# Patient Record
Sex: Female | Born: 2005 | Race: White | Hispanic: No | Marital: Single | Smoking: Never smoker
Health system: Southern US, Community
[De-identification: ages and names within clinical notes are randomized; demographics above are authoritative.]

## PROBLEM LIST (undated history)

## (undated) DIAGNOSIS — J302 Other seasonal allergic rhinitis: Secondary | ICD-10-CM

## (undated) DIAGNOSIS — F419 Anxiety disorder, unspecified: Secondary | ICD-10-CM

## (undated) HISTORY — DX: Anxiety disorder, unspecified: F41.9

## (undated) HISTORY — PX: NO PAST SURGERIES: SHX2092

---
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
## 2024-01-26 ENCOUNTER — Telehealth: Admitting: Physician Assistant

## 2024-01-26 DIAGNOSIS — H6503 Acute serous otitis media, bilateral: Secondary | ICD-10-CM | POA: Diagnosis not present

## 2024-01-26 MED ORDER — AMOXICILLIN-POT CLAVULANATE 875-125 MG PO TABS
1.0000 | ORAL_TABLET | Freq: Two times a day (BID) | ORAL | 0 refills | Status: DC
Start: 2024-01-26 — End: 2024-04-04

## 2024-01-26 MED ORDER — CIPROFLOXACIN-DEXAMETHASONE 0.3-0.1 % OT SUSP
4.0000 [drp] | Freq: Two times a day (BID) | OTIC | 0 refills | Status: AC
Start: 2024-01-26 — End: 2024-02-02

## 2024-01-26 NOTE — Progress Notes (Signed)

## 2024-04-04 ENCOUNTER — Encounter (HOSPITAL_COMMUNITY): Payer: Self-pay

## 2024-04-04 ENCOUNTER — Encounter

## 2024-04-04 ENCOUNTER — Telehealth: Admitting: Physician Assistant

## 2024-04-04 ENCOUNTER — Ambulatory Visit (HOSPITAL_COMMUNITY): Admission: EM | Admit: 2024-04-04 | Discharge: 2024-04-04 | Disposition: A | Discharge status: Home / Self Care

## 2024-04-04 DIAGNOSIS — J020 Streptococcal pharyngitis: Secondary | ICD-10-CM | POA: Insufficient documentation

## 2024-04-04 DIAGNOSIS — R519 Headache, unspecified: Secondary | ICD-10-CM

## 2024-04-04 DIAGNOSIS — R112 Nausea with vomiting, unspecified: Secondary | ICD-10-CM

## 2024-04-04 LAB — POCT MONO SCREEN (KUC): Mono, POC: NEGATIVE

## 2024-04-04 LAB — POCT RAPID STREP A (OFFICE): Rapid Strep A Screen: NEGATIVE

## 2024-04-04 MED ORDER — ACETAMINOPHEN 325 MG PO TABS
650.0000 mg | ORAL_TABLET | Freq: Once | ORAL | Status: AC
  Administered 2024-04-04: 650 mg via ORAL

## 2024-04-04 MED ORDER — PREDNISONE 20 MG PO TABS
40.0000 mg | ORAL_TABLET | Freq: Every day | ORAL | 0 refills | Status: AC
Start: 2024-04-04 — End: 2024-04-09

## 2024-04-04 MED ORDER — ACETAMINOPHEN 325 MG PO TABS
ORAL_TABLET | ORAL | Status: AC
  Filled 2024-04-04: qty 2

## 2024-04-04 MED ORDER — AMOXICILLIN 500 MG PO CAPS
500.0000 mg | ORAL_CAPSULE | Freq: Two times a day (BID) | ORAL | 0 refills | Status: AC
Start: 2024-04-04 — End: 2024-04-14

## 2024-04-04 NOTE — Discharge Instructions (Addendum)
  1. Streptococcal sore throat (Primary) - POCT rapid strep A complete in UC is negative for strep and - acetaminophen  (TYLENOL ) tablet 650 mg - POC mono screen completed UC is negative for mononucleosis - Culture, group A strep (throat) collected in UC sent to lab for further testing results should be available in 2 to 3 days. - amoxicillin  (AMOXIL ) 500 MG capsule; Take 1 capsule (500 mg total) by mouth 2 (two) times daily for 10 days.  Dispense: 20 capsule; Refill: 0 - predniSONE  (DELTASONE ) 20 MG tablet; Take 2 tablets (40 mg total) by mouth daily for 5 days.  Dispense: 10 tablet; Refill: 0 -Continue to monitor symptoms for any change in severity if there is any escalation of current symptoms or development of new symptoms follow-up in ER for further evaluation and management.

## 2024-04-04 NOTE — Progress Notes (Signed)
  Because of intractable headache for this long with nausea/vomiting and inability to eat, I feel your condition warrants further evaluation and I recommend that you be seen in a face-to-face visit.   NOTE: There will be NO CHARGE for this E-Visit   If you are having a true medical emergency, please call 911.     For an urgent face to face visit, [facility] has multiple urgent care centers for your convenience.  Click the link below for the full list of locations and hours, walk-in wait times, appointment scheduling options and driving directions:  Urgent Care - [place], [place], [place], [place], [place], [place]  [place]     Your MyChart E-visit questionnaire answers were reviewed by a board certified advanced clinical practitioner to complete your personal care plan based on your specific symptoms.    Thank you for using e-Visits.

## 2024-04-04 NOTE — ED Provider Notes (Signed)
 [facility]  Note:  This document was prepared using Dragon voice recognition software and may include unintentional dictation errors.  MRN: [id number] DOB: 15-Apr-2006  Subjective:   Judy Barnes is a 18 y.o. female presenting for sore throat, body aches, chills, fever, sweats, fatigue, cough, headache x 1 week.  Patient denies any known sick contacts.  Patient has not taken anything over-the-counter to treat symptoms.  Patient concern for strep pharyngitis.  No current facility-administered medications for this encounter.  Current Outpatient Medications:    amoxicillin  (AMOXIL ) 500 MG capsule, Take 1 capsule (500 mg total) by mouth 2 (two) times daily for 10 days., Disp: 20 capsule, Rfl: 0   predniSONE  (DELTASONE ) 20 MG tablet, Take 2 tablets (40 mg total) by mouth daily for 5 days., Disp: 10 tablet, Rfl: 0   No Known Allergies  Past Medical History:  Diagnosis Date   Asthma    Seasonal allergies      History reviewed. No pertinent surgical history.  History reviewed. No pertinent family history.  Social History   Tobacco Use   Smoking status: Never  Vaping Use   Vaping status: Never Used  Substance Use Topics   Alcohol use: No   Drug use: No    ROS Refer to HPI for ROS details.  Objective:   Vitals: BP 96/67 (BP Location: Left Arm)   Pulse (!) 157   Temp (!) 102.4 F (39.1 C) (Oral)   Resp 16   LMP 03/27/2024 (Exact Date)   SpO2 94%   Physical Exam Vitals and nursing note reviewed.  Constitutional:      General: She is not in acute distress.    Appearance: Normal appearance. She is well-developed. She is not ill-appearing or toxic-appearing.  HENT:     Head: Normocephalic and atraumatic.     Nose: Congestion present. No rhinorrhea.     Mouth/Throat:     Mouth: Mucous membranes are moist.     Pharynx: Oropharyngeal exudate and posterior oropharyngeal erythema present.  Eyes:     General:        Right eye: No discharge.        Left  eye: No discharge.     Extraocular Movements: Extraocular movements intact.     Conjunctiva/sclera: Conjunctivae normal.  Cardiovascular:     Rate and Rhythm: Normal rate.  Pulmonary:     Effort: Pulmonary effort is normal. No respiratory distress.  Skin:    General: Skin is warm and dry.  Neurological:     General: No focal deficit present.     Mental Status: She is alert and oriented to person, place, and time.  Psychiatric:        Mood and Affect: Mood normal.        Behavior: Behavior normal.     Procedures  Results for orders placed or performed during the hospital encounter of 04/04/24 (from the past 24 hours)  POCT rapid strep A     Status: None   Collection Time: 04/04/24  7:01 PM  Result Value Ref Range   Rapid Strep A Screen Negative Negative  POC mono screen     Status: Normal   Collection Time: 04/04/24  7:17 PM  Result Value Ref Range   Mono, POC Negative Negative    No results found.   Assessment and Plan :     Discharge Instructions       1. Streptococcal sore throat (Primary) - POCT rapid strep A complete in UC is negative  for strep and - acetaminophen  (TYLENOL ) tablet 650 mg - POC mono screen completed UC is negative for mononucleosis - Culture, group A strep (throat) collected in UC sent to lab for further testing results should be available in 2 to 3 days. - amoxicillin  (AMOXIL ) 500 MG capsule; Take 1 capsule (500 mg total) by mouth 2 (two) times daily for 10 days.  Dispense: 20 capsule; Refill: 0 - predniSONE  (DELTASONE ) 20 MG tablet; Take 2 tablets (40 mg total) by mouth daily for 5 days.  Dispense: 10 tablet; Refill: 0 -Continue to monitor symptoms for any change in severity if there is any escalation of current symptoms or development of new symptoms follow-up in ER for further evaluation and management.      Tequisha Maahs B Rael Tilly   Keela Rubert, [place] B, [place] 04/04/24 1930

## 2024-04-04 NOTE — ED Triage Notes (Signed)
 Patient here today with c/o fever, ST, body aches, chills, sweats, fatigue, cough, and headache X 1 week. No known sick contacts.

## 2024-04-07 LAB — CULTURE, GROUP A STREP (THRC)

## 2024-04-09 ENCOUNTER — Ambulatory Visit (HOSPITAL_COMMUNITY): Payer: Self-pay

## 2024-09-26 ENCOUNTER — Ambulatory Visit: Admission: EM | Admit: 2024-09-26 | Discharge: 2024-09-26 | Disposition: A | Discharge status: Home / Self Care

## 2024-09-26 ENCOUNTER — Encounter: Payer: Self-pay | Admitting: Emergency Medicine

## 2024-09-26 DIAGNOSIS — N912 Amenorrhea, unspecified: Secondary | ICD-10-CM | POA: Diagnosis not present

## 2024-09-26 DIAGNOSIS — Z3201 Encounter for pregnancy test, result positive: Secondary | ICD-10-CM

## 2024-09-26 DIAGNOSIS — O2692 Pregnancy related conditions, unspecified, second trimester: Secondary | ICD-10-CM | POA: Diagnosis not present

## 2024-09-26 DIAGNOSIS — Z3491 Encounter for supervision of normal pregnancy, unspecified, first trimester: Secondary | ICD-10-CM

## 2024-09-26 DIAGNOSIS — Z3A01 Less than 8 weeks gestation of pregnancy: Secondary | ICD-10-CM

## 2024-09-26 LAB — POCT URINE PREGNANCY: Preg Test, Ur: POSITIVE — AB

## 2024-09-26 NOTE — ED Provider Notes (Signed)
 " [facility]    CSN: [id number] Arrival date & time: 09/26/24  1252      History   Chief Complaint Chief Complaint  Patient presents with   Possible Pregnancy    HPI Judy Barnes is a 18 y.o. female.   Pt presents today due to amenorrhea since 07/29/2024. Pt states that she has had multiple positive pregnancy tests at home, she just needs documentation of pregnancy. Pt states that she is experiencing breast tenderness, nausea, vomiting, and round ligament pain. Pt denies changes in urinary habits or spotting.  The history is provided by the patient.  Possible Pregnancy    Past Medical History:  Diagnosis Date   Asthma    Seasonal allergies     There are no active problems to display for this patient.   History reviewed. No pertinent surgical history.  OB History   No obstetric history on file.      Home Medications    Prior to Admission medications  Not on File    Family History History reviewed. No pertinent family history.  Social History Social History[1]   Allergies   Patient has no known allergies.   Review of Systems Review of Systems   Physical Exam Triage Vital Signs ED Triage Vitals  Encounter Vitals Group     BP 09/26/24 1332 127/85     Girls Systolic BP Percentile --      Girls Diastolic BP Percentile --      Boys Systolic BP Percentile --      Boys Diastolic BP Percentile --      Pulse Rate 09/26/24 1332 81     Resp 09/26/24 1332 16     Temp 09/26/24 1332 97.9 F (36.6 C)     Temp Source 09/26/24 1332 Oral     SpO2 09/26/24 1332 98 %     Weight 09/26/24 1331 113 lb 12.8 oz (51.6 kg)     Height --      Head Circumference --      Peak Flow --      Pain Score 09/26/24 1330 0     Pain Loc --      Pain Education --      Exclude from Growth Chart --    No data found.  Updated Vital Signs BP 127/85 (BP Location: Left Arm)   Pulse 81   Temp 97.9 F (36.6 C) (Oral)   Resp 16   Wt 113 lb 12.8 oz (51.6 kg)    LMP 07/29/2024 (Exact Date)   SpO2 98%   Visual Acuity Right Eye Distance:   Left Eye Distance:   Bilateral Distance:    Right Eye Near:   Left Eye Near:    Bilateral Near:     Physical Exam Vitals and nursing note reviewed.  Constitutional:      General: She is not in acute distress.    Appearance: Normal appearance. She is not ill-appearing, toxic-appearing or diaphoretic.  Eyes:     General: No scleral icterus. Cardiovascular:     Rate and Rhythm: Normal rate and regular rhythm.     Heart sounds: Normal heart sounds.  Pulmonary:     Effort: Pulmonary effort is normal. No respiratory distress.     Breath sounds: Normal breath sounds. No wheezing or rhonchi.  Abdominal:     General: Abdomen is flat. Bowel sounds are normal.     Palpations: Abdomen is soft.     Tenderness: There is abdominal tenderness in  the right lower quadrant and left lower quadrant.     Comments: Tenderness to palpation in distribution of round ligament  Skin:    General: Skin is warm.  Neurological:     Mental Status: She is alert and oriented to person, place, and time.  Psychiatric:        Mood and Affect: Mood normal.        Behavior: Behavior normal.      UC Treatments / Results  Labs (all labs ordered are listed, but only abnormal results are displayed) Labs Reviewed  POCT URINE PREGNANCY - Abnormal; Notable for the following components:      Result Value   Preg Test, Ur Positive (*)    All other components within normal limits    EKG   Radiology No results found.  Procedures Procedures (including critical care time)  Medications Ordered in UC Medications - No data to display  Initial Impression / Assessment and Plan / UC Course  I have reviewed the triage vital signs and the nursing notes.  Pertinent labs & imaging results that were available during my care of the patient were reviewed by me and considered in my medical decision making (see chart for details).       Final Clinical Impressions(s) / UC Diagnoses   Final diagnoses:  Amenorrhea  First trimester pregnancy     Discharge Instructions      You may use Unisome and B6 together to help with nausea and vomiting, these can be purchased OTC.    ED Prescriptions   None    PDMP not reviewed this encounter.    [1]  Social History Tobacco Use   Smoking status: Never    Passive exposure: Current   Smokeless tobacco: Never  Vaping Use   Vaping status: Never Used  Substance Use Topics   Alcohol use: No   Drug use: No     Andra Corean BROCKS, PA-C 09/26/24 1405  "

## 2024-09-26 NOTE — Discharge Instructions (Addendum)
 You may use Unisome and B6 together to help with nausea and vomiting, these can be purchased OTC.

## 2024-09-26 NOTE — ED Triage Notes (Signed)
 Pt presents requesting pregnancy test. Pt reports LMP 11/02. No additional sxs.

## 2024-09-29 ENCOUNTER — Telehealth

## 2024-09-29 DIAGNOSIS — R112 Nausea with vomiting, unspecified: Secondary | ICD-10-CM | POA: Diagnosis not present

## 2024-09-30 ENCOUNTER — Encounter: Payer: Self-pay | Admitting: Nurse Practitioner

## 2024-09-30 NOTE — Progress Notes (Signed)
 You can take unisom or B6 for vomiting. These are over the counter. The migraine you can take tylenol .   E-Visit for Nausea and Vomiting   We are sorry that you are not feeling well. Here is how we plan to help!  Based on what you have shared with me it looks like you have a Virus that is irritating your GI tract.  Vomiting is the forceful emptying of a portion of the stomach's content through the mouth.  Although nausea and vomiting can make you feel miserable, it's important to remember that these are not diseases, but rather symptoms of an underlying illness.  When we treat short term symptoms, we always caution that any symptoms that persist should be fully evaluated in a medical office.   HOME CARE: Drink clear liquids.  This is very important! Dehydration (the lack of fluid) can lead to a serious complication.  Start off with 1 tablespoon every 5 minutes for 8 hours. You may begin eating bland foods after 8 hours without vomiting.  Start with saltine crackers, white bread, rice, mashed potatoes, applesauce. After 48 hours on a bland diet, you may resume a normal diet. Try to go to sleep.  Sleep often empties the stomach and relieves the need to vomit.  GET HELP RIGHT AWAY IF:  Your symptoms do not improve or worsen within 2 days after treatment. You have a fever for over 3 days. You cannot keep down fluids after trying the medication.  MAKE SURE YOU:  Understand these instructions. Will watch your condition. Will get help right away if you are not doing well or get worse.    Thank you for choosing an e-visit.  Your e-visit answers were reviewed by a board certified advanced clinical practitioner to complete your personal care plan. Depending upon the condition, your plan could have included both over the counter or prescription medications.  Please review your pharmacy choice. Make sure the pharmacy is open so you can pick up prescription now. If there is a problem, you may  contact your provider through [employer] and have the prescription routed to another pharmacy.  Your safety is important to us . If you have drug allergies check your prescription carefully.   For the next 24 hours you can use MyChart to ask questions about today's visit, request a non-urgent call back, or ask for a work or school excuse. You will get an email in the next two days asking about your experience. I hope that your e-visit has been valuable and will speed your recovery.  I have spent 5 minutes in review of e-visit questionnaire, review and updating patient chart, medical decision making and response to patient.   Mervin Ramires W Janann Boeve, NP

## 2024-10-10 ENCOUNTER — Other Ambulatory Visit: Payer: Self-pay

## 2024-10-10 ENCOUNTER — Ambulatory Visit (INDEPENDENT_AMBULATORY_CARE_PROVIDER_SITE_OTHER): Admitting: *Deleted

## 2024-10-10 ENCOUNTER — Other Ambulatory Visit (INDEPENDENT_AMBULATORY_CARE_PROVIDER_SITE_OTHER): Payer: Self-pay

## 2024-10-10 VITALS — BP 128/85 | HR 92 | Ht 68.0 in | Wt 113.0 lb

## 2024-10-10 DIAGNOSIS — Z3A10 10 weeks gestation of pregnancy: Secondary | ICD-10-CM

## 2024-10-10 DIAGNOSIS — O30001 Twin pregnancy, unspecified number of placenta and unspecified number of amniotic sacs, first trimester: Secondary | ICD-10-CM | POA: Diagnosis not present

## 2024-10-10 DIAGNOSIS — O3680X0 Pregnancy with inconclusive fetal viability, not applicable or unspecified: Secondary | ICD-10-CM

## 2024-10-10 DIAGNOSIS — O30039 Twin pregnancy, monochorionic/diamniotic, unspecified trimester: Secondary | ICD-10-CM | POA: Insufficient documentation

## 2024-10-10 DIAGNOSIS — O0991 Supervision of high risk pregnancy, unspecified, first trimester: Secondary | ICD-10-CM | POA: Diagnosis not present

## 2024-10-10 DIAGNOSIS — O30009 Twin pregnancy, unspecified number of placenta and unspecified number of amniotic sacs, unspecified trimester: Secondary | ICD-10-CM | POA: Insufficient documentation

## 2024-10-10 MED ORDER — ONDANSETRON 4 MG PO TBDP: 4.0000 mg | ORAL_TABLET | Freq: Four times a day (QID) | ORAL | 2 refills | Status: AC | PRN

## 2024-10-10 NOTE — Progress Notes (Signed)
 New OB Intake  I explained I am completing New OB Intake today. We discussed EDD of 05/05/2025, by Last Menstrual Period. Pt is G1P0. I reviewed her allergies, medications and Medical/Surgical/OB history.    Patient Active Problem List   Diagnosis Date Noted   Supervision of high risk pregnancy in first trimester 10/10/2024   Twin gestation with unknown number of placentas and amniotic sacs 10/10/2024    Concerns addressed today  Patient informed that the ultrasound is considered a limited obstetric ultrasound and is not intended to be a complete ultrasound exam.  Patient also informed that the ultrasound is not being completed with the intent of assessing for fetal or placental anomalies or any pelvic abnormalities. Explained that the purpose of today's ultrasound is to assess for viability.  Patient acknowledges the purpose of the exam and the limitations of the study.     Delivery Plans Plans to deliver at [facility]. Discussed the nature of our practice with multiple providers including residents and students. Due to the size of the practice, the delivering provider may not be the same as those providing prenatal care.   MyChart/Babyscripts MyChart access verified. I explained pt will have some visits in office and some virtually. Babyscripts app discussed and ordered.   Blood Pressure Cuff Blood pressure cuff discussed and given.Discussed to be used for virtual visits and or if needed BP checks weekly.  Anatomy US  Explained first scheduled will be in 2 weeks to determine the type of twin pregnancy   Last Pap NA due to age  First visit review I reviewed new OB appt with patient. Explained pt will be seen by Claris, CNM at first visit. Discussed [place] genetic screening with patient will get panorama at new OB. Routine prenatal labs to be collected at [facility].    Wanda Buckles, RN 10/10/2024  11:07 AM

## 2024-10-23 ENCOUNTER — Ambulatory Visit: Attending: Obstetrics and Gynecology

## 2024-10-23 ENCOUNTER — Other Ambulatory Visit: Payer: Self-pay | Admitting: *Deleted

## 2024-10-23 ENCOUNTER — Ambulatory Visit

## 2024-10-23 VITALS — BP 111/64

## 2024-10-23 DIAGNOSIS — Z3689 Encounter for other specified antenatal screening: Secondary | ICD-10-CM | POA: Diagnosis not present

## 2024-10-23 DIAGNOSIS — O30001 Twin pregnancy, unspecified number of placenta and unspecified number of amniotic sacs, first trimester: Secondary | ICD-10-CM

## 2024-10-23 DIAGNOSIS — Z3A12 12 weeks gestation of pregnancy: Secondary | ICD-10-CM

## 2024-10-23 DIAGNOSIS — O30031 Twin pregnancy, monochorionic/diamniotic, first trimester: Secondary | ICD-10-CM

## 2024-10-23 DIAGNOSIS — O0991 Supervision of high risk pregnancy, unspecified, first trimester: Secondary | ICD-10-CM | POA: Diagnosis not present

## 2024-10-23 DIAGNOSIS — Z3401 Encounter for supervision of normal first pregnancy, first trimester: Secondary | ICD-10-CM

## 2024-10-23 NOTE — Progress Notes (Signed)
" ° °  Monochorionic/Diamniotic Twin Initial TTTS Evaluation A thin dividing membrane was noted separating the two fetuses along with a single placenta, indicating that these are monochorionic, diamniotic twins.  The fetal growth and amniotic fluid level appeared appropriate for her gestational age for both fetuses.  The implications and management of monochorionic twins was discussed.   The 10% to 15% risk of twin to twin transfusion syndrome seen in monochorionic, diamniotic twins was discussed today.    The implications and management of twin to twin transfusion syndrome (TTTS) should she develop this complication was also discussed.    She was advised that we will continue to follow her closely with serial ultrasounds to assess for signs of TTTS.  She was advised that management of twin pregnancies will involve frequent ultrasound exams to assess the fetal growth and amniotic fluid level.    We will continue to follow her with ultrasound exams every 2 weeks starting at around 16 weeks to assess for signs of TTTS.    Weekly fetal testing should be started at around 32 weeks.    Delivery for uncomplicated monochorionic twins is recommended at around 37 weeks.  The increased risk of preeclampsia, gestational diabetes, and preterm birth/labor associated with twin pregnancies was discussed.    As pregnancies with multiple gestations are at increased risk for developing preeclampsia, she was advised to start taking 2 tablets of baby aspirin daily (81 mg each) to decrease her risk of developing preeclampsia  as soon as possible. "

## 2024-10-23 NOTE — Progress Notes (Signed)
 MFM Consult Note  Judy Barnes is currently at [redacted]w[redacted]d. She was seen today for a detailed fetal anatomy scan due to a spontaneously conceived twin pregnancy.  She denies any significant past medical history and denies any problems in her current pregnancy.    She has not had a cell free DNA test drawn yet in her current pregnancy.  Sonographic findings  A thin dividing membrane along with a single placenta was noted on today's exam, indicating that these are monochorionic, diamniotic twins.  The crown-rump lengths measured for both twin A and twin B today are consistent with an EDC of May 05, 2025, making her 12 weeks and 2 days pregnant today.  There was normal amniotic fluid noted around both twin A and twin B today.  Monochorionic/Diamniotic Twins  The implications and management of monochorionic twins was discussed.   The 10% to 15% risk of twin to twin transfusion syndrome seen in monochorionic, diamniotic twins was discussed today.    The implications and management of twin to twin transfusion syndrome (TTTS) should she develop this complication was also discussed.    She was advised that we will continue to follow her closely with serial ultrasounds to assess for signs of TTTS.  She was advised that management of twin pregnancies will involve frequent ultrasound exams to assess the fetal growth and amniotic fluid level.    We will continue to follow her with ultrasound exams every 2 weeks starting at around 16 weeks to assess for signs of TTTS.    Weekly fetal testing should be started at around 32 weeks.    Delivery for uncomplicated monochorionic twins is recommended at around 37 weeks.  The increased risk of preeclampsia, gestational diabetes, and preterm birth/labor associated with twin pregnancies was discussed.    As pregnancies with multiple gestations are at increased risk for developing preeclampsia, she was advised to start taking 1 tablet of baby aspirin daily  (81 mg) to decrease her risk of developing preeclampsia.   She will return in 4 weeks for another ultrasound exam to screen for TTTS.  As she has not had a cell free DNA test drawn in her current pregnancy, she had the Panorama cell free DNA test drawn following today's ultrasound exam.  She understands that this test will confirm that these are monozygotic (identical) twins.  The patient stated that all of her questions were answered.   A total of 45 minutes was spent counseling and coordinating the care for this patient.  Greater than 50% of the time was spent in direct face-to-face contact.

## 2024-10-24 ENCOUNTER — Encounter: Payer: Self-pay | Admitting: Obstetrics and Gynecology

## 2024-10-31 ENCOUNTER — Ambulatory Visit: Admitting: Certified Nurse Midwife

## 2024-10-31 ENCOUNTER — Other Ambulatory Visit (HOSPITAL_COMMUNITY): Admission: RE | Admit: 2024-10-31 | Discharge: 2024-10-31 | Disposition: A | Source: Ambulatory Visit

## 2024-10-31 ENCOUNTER — Other Ambulatory Visit: Payer: Self-pay

## 2024-10-31 VITALS — BP 120/77 | HR 116 | Wt 117.0 lb

## 2024-10-31 DIAGNOSIS — Z3A13 13 weeks gestation of pregnancy: Secondary | ICD-10-CM

## 2024-10-31 DIAGNOSIS — O09891 Supervision of other high risk pregnancies, first trimester: Secondary | ICD-10-CM

## 2024-10-31 DIAGNOSIS — O30031 Twin pregnancy, monochorionic/diamniotic, first trimester: Secondary | ICD-10-CM | POA: Diagnosis not present

## 2024-10-31 DIAGNOSIS — O0992 Supervision of high risk pregnancy, unspecified, second trimester: Secondary | ICD-10-CM

## 2024-10-31 DIAGNOSIS — O09892 Supervision of other high risk pregnancies, second trimester: Secondary | ICD-10-CM

## 2024-10-31 DIAGNOSIS — O30032 Twin pregnancy, monochorionic/diamniotic, second trimester: Secondary | ICD-10-CM

## 2024-10-31 DIAGNOSIS — O0991 Supervision of high risk pregnancy, unspecified, first trimester: Secondary | ICD-10-CM | POA: Diagnosis not present

## 2024-10-31 MED ORDER — PREPLUS 27-1 MG PO TABS: 1.0000 | ORAL_TABLET | Freq: Every day | ORAL | 13 refills | Status: AC

## 2024-10-31 MED ORDER — ASPIRIN 81 MG PO CHEW: 81.0000 mg | CHEWABLE_TABLET | Freq: Every day | ORAL | 3 refills | Status: AC

## 2024-10-31 NOTE — Progress Notes (Unsigned)
 "  INITIAL PRENATAL VISIT  History:  Judy Barnes is a 19 y.o. G1P0 at [redacted]w[redacted]d by {Ob dating:14516} being seen today for her first obstetrical visit.  Her obstetrical history is significant for {ob risk factors:10154}. Patient {does/does not:19097} intend to breast feed. Pregnancy history fully reviewed.  Patient reports {sx:14538}.  HISTORY: OB History  Gravida Para Term Preterm AB Living  1 0 0 0 0 0  SAB IAB Ectopic Multiple Live Births  0 0 0 0 0    # Outcome Date GA Lbr Len/2nd Weight Sex Type Anes PTL Lv  1 Current             Last pap smear was done *** and was {Normal/Abnormal Appearance:21344::normal}  Past Medical History:  Diagnosis Date   Anxiety    Asthma    Seasonal allergies    Past Surgical History:  Procedure Laterality Date   NO PAST SURGERIES     WISDOM TOOTH EXTRACTION Bilateral    Family History  Problem Relation Age of Onset   Asthma Mother    Asthma Maternal Aunt    Diabetes Maternal Grandmother    Diabetes Maternal Grandfather    Cancer Paternal Grandmother    Diabetes Paternal Grandmother    Diabetes Paternal Grandfather    Hypertension Other    Heart disease Other    Cancer Other    Social History[1] Allergies[2] Medications Ordered Prior to Encounter[3]  Review of Systems Pertinent items noted in HPI and remainder of comprehensive ROS otherwise negative.  Indications for ASA therapy (per UpToDate) One of the following: (Needs 162 mg daily) Previous pregnancy with preeclampsia, especially early onset and with an adverse outcome {yes/no:20286} Chronic hypertension {yes/no:20286} Type 1 or 2 diabetes mellitus {yes/no:20286} Multifetal gestation {yes/no:20286} Chronic kidney disease {yes/no:20286} Autoimmune disease (antiphospholipid syndrome, systemic lupus erythematosus) {yes/no:20286} Two or more of the following: (Can do 81 mg daily) Nulliparity {yes/no:20286} Obesity (body mass index >30 kg/m2) {yes/no:20286} Family history of  preeclampsia in mother or sister {yes/no:20286} Age >=35 years {yes/no:20286} Sociodemographic characteristics (African American race, low socioeconomic level) {yes/no:20286} Personal risk factors (eg, previous pregnancy with low birth weight or small for gestational age infant, previous adverse pregnancy outcome [eg, stillbirth], interval >10 years between pregnancies) {yes/no:20286} In vitro conception {yes/no:20286}  Physical Exam:   Vitals:   10/31/24 0929  BP: 120/77  Pulse: (!) 116  Weight: 117 lb (53.1 kg)   Fetal Heart Rate (bpm): 157/153  Uterine size:   ***Bedside Ultrasound for FHR check: Viable intrauterine pregnancy with positive cardiac activity noted, fetal heart rate ***bpm  General: well-developed, well-nourished female in no acute distress  Breasts:  normal appearance, no masses or tenderness bilaterally, exam done in the presence of a chaperone.   Skin: normal coloration and turgor, no rashes  Neurologic: oriented, normal, negative, normal mood  Extremities: normal strength, tone, and muscle mass, ROM of all joints is normal  HEENT PERRLA, extraocular movement intact and sclera clear, anicteric  Neck supple and no masses  Cardiovascular: regular rate and rhythm  Respiratory:  no respiratory distress, normal breath sounds  Abdomen: soft, non-tender; bowel sounds normal; no masses,  no organomegaly  Pelvic: normal external genitalia, no lesions, normal vaginal mucosa, normal vaginal discharge, normal cervix, ***pap smear done. Exam done in the presence of a chaperone.    Assessment:  Pregnancy: G1P0 Patient Active Problem List   Diagnosis Date Noted   Supervision of high risk pregnancy in first trimester 10/10/2024   Monochorionic diamniotic twin gestation 10/10/2024  Plan:  1. Supervision of high risk pregnancy in second trimester (Primary) *** - Culture, OB Urine - CBC/D/Plt+RPR+Rh+ABO+RubIgG... - Cervicovaginal ancillary only( [place]) - Hemoglobin  A1c  2. [redacted] weeks gestation of pregnancy *** - US  OB Limited; Future - Culture, OB Urine - CBC/D/Plt+RPR+Rh+ABO+RubIgG... - Cervicovaginal ancillary only( [place]) - Hemoglobin A1c  3. Monochorionic diamniotic twin gestation in second trimester *** - US  OB Limited; Future - Culture, OB Urine - CBC/D/Plt+RPR+Rh+ABO+RubIgG... - Cervicovaginal ancillary only( [place]) - Hemoglobin A1c  4. High risk teen pregnancy in second trimester *** - Culture, OB Urine - CBC/D/Plt+RPR+Rh+ABO+RubIgG... - Cervicovaginal ancillary only( [place]) - Hemoglobin A1c   Initial labs drawn. Continue prenatal vitamins. Problem list reviewed and updated. Genetic Screening discussed, {Blank multiple:19196::Panorama,[facility]}: {requests/ordered/declines:14581}. Ultrasound discussed; fetal anatomic survey: {Planned/scheduled/na:14534}. Anticipatory guidance about prenatal visits given including labs, ultrasounds, and testing. Weight gain recommendations per IOM guidelines reviewed: underweight/BMI 18.5 or less > 28 - 40 lbs; normal weight/BMI 18.5 - 24.9 > 25 - 35 lbs; overweight/BMI 25 - 29.9 > 15 - 25 lbs; obese/BMI 30 or more > 11 - 20 lbs. Discussed usage of the Babyscripts app for more information about pregnancy, and to track blood pressures. Also discussed usage of virtual visits as additional source of managing and completing prenatal visits.  Patient was encouraged to use MyChart to review results, send requests, and have questions addressed.   The nature of [facility] Practice with multiple MDs and Advanced Practice Providers was explained to patient; also emphasized that residents, students are part of our team.  Also emphasized that we do have female providers; female-only providers cannot be guaranteed. Routine obstetric precautions reviewed. Encouraged to seek out care at our office or emergency room [facility] preferred) for urgent and/or emergent  concerns. Return in about 3 weeks (around 11/21/2024) for HROB with CNM or MD.     Delilah Erven) Emilio, MSN, CNM  [facility]  10/31/2024 9:49 AM        [1]  Social History Tobacco Use   Smoking status: Never    Passive exposure: Current   Smokeless tobacco: Never  Vaping Use   Vaping status: Never Used  Substance Use Topics   Alcohol use: No   Drug use: No  [2] No Known Allergies [3]  Current Outpatient Medications on File Prior to Visit  Medication Sig Dispense Refill   ondansetron  (ZOFRAN -ODT) 4 MG disintegrating tablet Take 1 tablet (4 mg total) by mouth every 6 (six) hours as needed for nausea. 20 tablet 2   No current facility-administered medications on file prior to visit.   "

## 2024-10-31 NOTE — Progress Notes (Unsigned)

## 2024-11-01 ENCOUNTER — Ambulatory Visit: Payer: Self-pay

## 2024-11-01 LAB — CBC/D/PLT+RPR+RH+ABO+RUBIGG...
Antibody Screen: NEGATIVE
Basophils Absolute: 0 10*3/uL (ref 0.0–0.2)
Basos: 0 %
EOS (ABSOLUTE): 0.2 10*3/uL (ref 0.0–0.4)
Eos: 2 %
HCV Ab: NONREACTIVE
HIV Screen 4th Generation wRfx: NONREACTIVE
Hematocrit: 37.2 % (ref 34.0–46.6)
Hemoglobin: 12.3 g/dL (ref 11.1–15.9)
Hepatitis B Surface Ag: NEGATIVE
Immature Grans (Abs): 0.1 10*3/uL (ref 0.0–0.1)
Immature Granulocytes: 1 %
Lymphocytes Absolute: 2.4 10*3/uL (ref 0.7–3.1)
Lymphs: 22 %
MCH: 30.7 pg (ref 26.6–33.0)
MCHC: 33.1 g/dL (ref 31.5–35.7)
MCV: 93 fL (ref 79–97)
Monocytes Absolute: 1 10*3/uL — ABNORMAL HIGH (ref 0.1–0.9)
Monocytes: 9 %
Neutrophils Absolute: 7.4 10*3/uL — ABNORMAL HIGH (ref 1.4–7.0)
Neutrophils: 66 %
Platelets: 270 10*3/uL (ref 150–450)
RBC: 4.01 x10E6/uL (ref 3.77–5.28)
RDW: 12.9 % (ref 11.7–15.4)
RPR Ser Ql: NONREACTIVE
Rh Factor: POSITIVE
Rubella Antibodies, IGG: 1.34 {index}
WBC: 11 10*3/uL — ABNORMAL HIGH (ref 3.4–10.8)

## 2024-11-01 LAB — CERVICOVAGINAL ANCILLARY ONLY
Chlamydia: NEGATIVE
Comment: NEGATIVE
Comment: NEGATIVE
Comment: NORMAL
Neisseria Gonorrhea: NEGATIVE
Trichomonas: NEGATIVE

## 2024-11-01 LAB — PANORAMA PRENATAL TEST FULL PANEL:PANORAMA TEST PLUS 5 ADDITIONAL MICRODELETIONS: FETAL FRACTION: 17.6

## 2024-11-01 LAB — HEMOGLOBIN A1C
Est. average glucose Bld gHb Est-mCnc: 91 mg/dL
Hgb A1c MFr Bld: 4.8 % (ref 4.8–5.6)

## 2024-11-01 LAB — HCV INTERPRETATION

## 2024-11-23 ENCOUNTER — Encounter: Admitting: Obstetrics and Gynecology

## 2024-12-04 ENCOUNTER — Other Ambulatory Visit

## 2024-12-18 ENCOUNTER — Other Ambulatory Visit

## 2024-12-20 ENCOUNTER — Encounter: Admitting: Obstetrics and Gynecology
